# Patient Record
Sex: Female | Born: 1990 | Race: White | Hispanic: No | Marital: Single | Smoking: Never smoker
Health system: Southern US, Community
[De-identification: ages and names within clinical notes are randomized; demographics above are authoritative.]

## PROBLEM LIST (undated history)

## (undated) ENCOUNTER — Inpatient Hospital Stay (HOSPITAL_COMMUNITY): Payer: Self-pay

## (undated) DIAGNOSIS — Z789 Other specified health status: Secondary | ICD-10-CM

## (undated) HISTORY — PX: NO PAST SURGERIES: SHX2092

---
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
## 2013-07-25 ENCOUNTER — Emergency Department (HOSPITAL_COMMUNITY): Payer: Medicaid Other

## 2013-07-25 ENCOUNTER — Emergency Department (HOSPITAL_COMMUNITY)
Admission: EM | Admit: 2013-07-25 | Discharge: 2013-07-25 | Disposition: A | Discharge status: Home / Self Care | Payer: Medicaid Other | Attending: Emergency Medicine | Admitting: Emergency Medicine

## 2013-07-25 ENCOUNTER — Encounter (HOSPITAL_COMMUNITY): Payer: Self-pay | Admitting: Emergency Medicine

## 2013-07-25 DIAGNOSIS — O9989 Other specified diseases and conditions complicating pregnancy, childbirth and the puerperium: Secondary | ICD-10-CM | POA: Insufficient documentation

## 2013-07-25 DIAGNOSIS — R197 Diarrhea, unspecified: Secondary | ICD-10-CM | POA: Diagnosis not present

## 2013-07-25 DIAGNOSIS — R110 Nausea: Secondary | ICD-10-CM | POA: Insufficient documentation

## 2013-07-25 DIAGNOSIS — O2390 Unspecified genitourinary tract infection in pregnancy, unspecified trimester: Secondary | ICD-10-CM | POA: Insufficient documentation

## 2013-07-25 DIAGNOSIS — Z3490 Encounter for supervision of normal pregnancy, unspecified, unspecified trimester: Secondary | ICD-10-CM

## 2013-07-25 DIAGNOSIS — N3000 Acute cystitis without hematuria: Secondary | ICD-10-CM | POA: Insufficient documentation

## 2013-07-25 LAB — HCG, QUANTITATIVE, PREGNANCY: HCG, BETA CHAIN, QUANT, S: 18176 m[IU]/mL — AB (ref <5)

## 2013-07-25 LAB — COMPREHENSIVE METABOLIC PANEL
ALBUMIN: 3.3 g/dL — AB (ref 3.5–5.2)
ALK PHOS: 70 U/L (ref 39–117)
ALT: 21 U/L (ref 0–35)
ANION GAP: 13 (ref 5–15)
AST: 32 U/L (ref 0–37)
BILIRUBIN TOTAL: 0.4 mg/dL (ref 0.3–1.2)
BUN: 11 mg/dL (ref 6–23)
CHLORIDE: 100 meq/L (ref 96–112)
CO2: 22 meq/L (ref 19–32)
Calcium: 9 mg/dL (ref 8.4–10.5)
Creatinine, Ser: 0.59 mg/dL (ref 0.50–1.10)
GFR calc Af Amer: >90 mL/min (ref >90)
GLUCOSE: 73 mg/dL (ref 70–99)
POTASSIUM: 5.1 meq/L (ref 3.7–5.3)
Sodium: 135 mEq/L — ABNORMAL LOW (ref 137–147)
Total Protein: 6.9 g/dL (ref 6.0–8.3)

## 2013-07-25 LAB — URINALYSIS, ROUTINE W REFLEX MICROSCOPIC
Bilirubin Urine: NEGATIVE
GLUCOSE, UA: NEGATIVE mg/dL
HGB URINE DIPSTICK: NEGATIVE
Ketones, ur: NEGATIVE mg/dL
Nitrite: NEGATIVE
PROTEIN: NEGATIVE mg/dL
Specific Gravity, Urine: 1.02 (ref 1.005–1.030)
Urobilinogen, UA: 1 mg/dL (ref 0.0–1.0)
pH: 5.5 (ref 5.0–8.0)

## 2013-07-25 LAB — CBC WITH DIFFERENTIAL/PLATELET
BASOS PCT: 0 % (ref 0–1)
Basophils Absolute: 0 10*3/uL (ref 0.0–0.1)
Eosinophils Absolute: 0.2 10*3/uL (ref 0.0–0.7)
Eosinophils Relative: 1 % (ref 0–5)
HEMATOCRIT: 39.1 % (ref 36.0–46.0)
HEMOGLOBIN: 13.1 g/dL (ref 12.0–15.0)
LYMPHS ABS: 3.4 10*3/uL (ref 0.7–4.0)
LYMPHS PCT: 27 % (ref 12–46)
MCH: 29.2 pg (ref 26.0–34.0)
MCHC: 33.5 g/dL (ref 30.0–36.0)
MCV: 87.3 fL (ref 78.0–100.0)
MONOS PCT: 7 % (ref 3–12)
Monocytes Absolute: 0.9 10*3/uL (ref 0.1–1.0)
NEUTROS ABS: 8.2 10*3/uL — AB (ref 1.7–7.7)
NEUTROS PCT: 65 % (ref 43–77)
Platelets: 329 10*3/uL (ref 150–400)
RBC: 4.48 MIL/uL (ref 3.87–5.11)
RDW: 12.7 % (ref 11.5–15.5)
WBC: 12.7 10*3/uL — AB (ref 4.0–10.5)

## 2013-07-25 LAB — POC URINE PREG, ED: Preg Test, Ur: POSITIVE — AB

## 2013-07-25 LAB — URINE MICROSCOPIC-ADD ON

## 2013-07-25 LAB — LIPASE, BLOOD: Lipase: 27 U/L (ref 11–59)

## 2013-07-25 MED ORDER — CEPHALEXIN 500 MG PO CAPS: 500.0000 mg | ORAL_CAPSULE | Freq: Four times a day (QID) | ORAL | Status: DC

## 2013-07-25 MED ORDER — CEPHALEXIN 250 MG PO CAPS
500.0000 mg | ORAL_CAPSULE | Freq: Once | ORAL | Status: AC
  Administered 2013-07-25: 500 mg via ORAL
  Filled 2013-07-25: qty 2

## 2013-07-25 NOTE — Discharge Instructions (Signed)
Please read and follow all provided instructions.  Your diagnoses today include:  1. Acute cystitis without hematuria   2. Pregnancy     Tests performed today include:  Urine test - suggests that you have an infection in your bladder  Urine pregnancy test - you are pregnant.   Ultrasound - shows that you have a pregnancy in the uterus and are 5 weeks and 6 days along.   Vital signs. See below for your results today.   Medications prescribed:   Keflex (cephalexin) - antibiotic  You have been prescribed an antibiotic medicine: take the entire course of medicine even if you are feeling better. Stopping early can cause the antibiotic not to work.  Home care instructions:  Follow any educational materials contained in this packet.  Follow-up instructions: Please follow-up with your primary care provider in 3 days if symptoms are not resolved for further evaluation of your symptoms.  Return instructions:   Please return to the Emergency Department if you experience worsening symptoms.   Return with fever, worsening pain, persistent vomiting, worsening pain in your back.   Please return if you have any other emergent concerns.  Additional Information:  Your vital signs today were: BP 113/46   Pulse 81   Temp(Src) 98.8 F (37.1 C) (Oral)   Resp 20   Wt 264 lb 2 oz (119.806 kg)   SpO2 100%   LMP 06/10/2013 If your blood pressure (BP) was elevated above 135/85 this visit, please have this repeated by your doctor within one month. --------------

## 2013-07-25 NOTE — ED Notes (Signed)
Pt reports 6/10 right lower abdominal pain that started this morning. Pt states she has had nausea, diarrhea, denies vomiting. Pt denies urinary symptoms, fever/chills.  Pt concerns she is could be pregnant. LMP 06/10/13. NAD noted, pt A&O x4.

## 2013-07-25 NOTE — ED Notes (Signed)
Patient transported to Ultrasound 

## 2013-07-25 NOTE — ED Notes (Signed)
Pt states she has had diarrhea for about five days. Reports lower abdominal pain, states not specifically the right side as earlier stated. Nausea at times (with a positive urine pregnancy test).

## 2013-07-25 NOTE — ED Provider Notes (Signed)
CSN: [id number]     Arrival date & time 07/25/13  1709 History   First MD Initiated Contact with Patient 07/25/13 2023     Chief Complaint  Patient presents with  . Abdominal Pain     (Consider location/radiation/quality/duration/timing/severity/associated sxs/prior Treatment) HPI Comments: Patient presents with complaint of suprapubic abdominal pain that began this morning with associated nausea, diarrhea. No vomiting. Patient denies fever, chills, chest pain or cough. No shortness of breath. No vaginal bleeding or discharge. Last menstrual period was approximately 6 weeks ago and was normal for her. Patient thinks that she could be pregnant. These symptoms do not feel like previous UTI. She does not have right lower quadrant or left lower quadrant pain. She has not taken a home pregnancy test. No treatments prior to arrival. The onset of this condition was acute. The course is constant. Aggravating factors: none. Alleviating factors: none.    Patient is a 23 y.o. female presenting with abdominal pain. The history is provided by the patient.  Abdominal Pain Associated symptoms: diarrhea and nausea   Associated symptoms: no chest pain, no cough, no dysuria, no fever, no sore throat, no vaginal bleeding, no vaginal discharge and no vomiting     History reviewed. No pertinent past medical history. History reviewed. No pertinent past surgical history. History reviewed. No pertinent family history. History  Substance Use Topics  . Smoking status: Never Smoker   . Smokeless tobacco: Not on file  . Alcohol Use: Yes     Comment: occasionaly   OB History   Grav Para Term Preterm Abortions TAB SAB Ect Mult Living   1 1 1   0     1     Review of Systems  Constitutional: Negative for fever.  HENT: Negative for rhinorrhea and sore throat.   Eyes: Negative for redness.  Respiratory: Negative for cough.   Cardiovascular: Negative for chest pain.  Gastrointestinal: Positive for nausea,  abdominal pain (suprapubic) and diarrhea. Negative for vomiting.  Genitourinary: Negative for dysuria, urgency, flank pain, vaginal bleeding and vaginal discharge.  Musculoskeletal: Negative for myalgias.  Skin: Negative for rash.  Neurological: Negative for headaches.    Allergies  Review of patient's allergies indicates no known allergies.  Home Medications   Prior to Admission medications   Not on File   BP 113/46  Pulse 79  Temp(Src) 98.8 F (37.1 C) (Oral)  Resp 17  Wt 264 lb 2 oz (119.806 kg)  SpO2 100%  LMP 06/10/2013  Physical Exam  Nursing note and vitals reviewed. Constitutional: She appears well-developed and well-nourished.  HENT:  Head: Normocephalic and atraumatic.  Eyes: Conjunctivae are normal. Right eye exhibits no discharge. Left eye exhibits no discharge.  Neck: Normal range of motion. Neck supple.  Cardiovascular: Normal rate, regular rhythm and normal heart sounds.   No murmur heard. Pulmonary/Chest: Effort normal and breath sounds normal. No respiratory distress. She has no wheezes. She has no rales.  Abdominal: Soft. There is tenderness (Mild, suprapubic). There is no rebound and no guarding.  Neurological: She is alert.  Skin: Skin is warm and dry.  Psychiatric: She has a normal mood and affect.    ED Course  Procedures (including critical care time) Labs Review Labs Reviewed  CBC WITH DIFFERENTIAL - Abnormal; Notable for the following:    WBC 12.7 (*)    Neutro Abs 8.2 (*)    All other components within normal limits  COMPREHENSIVE METABOLIC PANEL - Abnormal; Notable for the following:  Sodium 135 (*)    Albumin 3.3 (*)    All other components within normal limits  URINALYSIS, ROUTINE W REFLEX MICROSCOPIC - Abnormal; Notable for the following:    APPearance CLOUDY (*)    Leukocytes, UA SMALL (*)    All other components within normal limits  URINE MICROSCOPIC-ADD ON - Abnormal; Notable for the following:    Squamous Epithelial / LPF  FEW (*)    Bacteria, UA MANY (*)    All other components within normal limits  HCG, QUANTITATIVE, PREGNANCY - Abnormal; Notable for the following:    hCG, Beta Chain, Quant, S 18176 (*)    All other components within normal limits  POC URINE PREG, ED - Abnormal; Notable for the following:    Preg Test, Ur POSITIVE (*)    All other components within normal limits  URINE CULTURE  LIPASE, BLOOD    Imaging Review US Ob Comp Less 14 Wks  07/25/2013   CLINICAL DATA:  Pelvic pain  EXAM: OBSTETRIC <14 WK [place] AND TRANSVAGINAL OB US  TECHNIQUE: Both transabdominal and transvaginal ultrasound examinations were performed for complete evaluation of the gestation as well as the maternal uterus, adnexal regions, and pelvic cul-de-sac. Transvaginal technique was performed to assess early pregnancy.  COMPARISON:  None.  FINDINGS: Intrauterine gestational sac: Visualized/normal in shape.  Yolk sac:  Visualized  Embryo:  Visualized  Cardiac Activity: Visualized  Heart Rate:  113 bpm  CRL:   3  mm   5 w 6 d                  [place] EDC: March 20, 2013  Maternal uterus/adnexae: There is no demonstrable subchorionic hemorrhage. Cervical os appears closed. Maternal ovaries appear normal bilaterally. No free pelvic fluid.  IMPRESSION: Single live intrauterine gestation with estimated gestational age of 6- weeks. Study otherwise unremarkable.   Electronically Signed   By: Bretta Bang M.D.   On: 07/25/2013 21:57   US Ob Transvaginal  07/25/2013   CLINICAL DATA:  Pelvic pain  EXAM: OBSTETRIC <14 WK [place] AND TRANSVAGINAL OB US  TECHNIQUE: Both transabdominal and transvaginal ultrasound examinations were performed for complete evaluation of the gestation as well as the maternal uterus, adnexal regions, and pelvic cul-de-sac. Transvaginal technique was performed to assess early pregnancy.  COMPARISON:  None.  FINDINGS: Intrauterine gestational sac: Visualized/normal in shape.  Yolk sac:  Visualized  Embryo:  Visualized  Cardiac  Activity: Visualized  Heart Rate:  113 bpm  CRL:   3  mm   5 w 6 d                  [place] EDC: March 20, 2013  Maternal uterus/adnexae: There is no demonstrable subchorionic hemorrhage. Cervical os appears closed. Maternal ovaries appear normal bilaterally. No free pelvic fluid.  IMPRESSION: Single live intrauterine gestation with estimated gestational age of 23- weeks. Study otherwise unremarkable.   Electronically Signed   By: Bretta Bang M.D.   On: 07/25/2013 21:57     EKG Interpretation None      9:15 PM Patient seen and examined. Work-up initiated. Quant and [place] ordered. Suspect UTI. Will ensure IUP.    Vital signs reviewed and are as follows: Filed Vitals:   07/25/13 2059  BP: 113/46  Pulse: 79  Temp:   Resp: 17   IUP confirmed. Patient informed. First dose of Keflex given in emergency department tonight.  Encouraged patient to begin prenatal vitamins and to followup with her  PCP/GYN.  Patient urged to return with worsening abdominal pain, vaginal bleeding, fever, vomiting, or other concerns. She verbalizes understanding and agrees with plan.   MDM   Final diagnoses:  Acute cystitis without hematuria  Pregnancy   Pregnancy: Due to lower abdominal cramping, IUP confirmed with ultrasound. Do not suspect ectopic pregnancy.  Acute cystitis: Explains patient's urinary symptoms, will treat with Keflex. Culture sent.   Renne CriglerJoshua Zamauri Nez, PA-C 07/25/13 2329

## 2013-07-26 NOTE — ED Provider Notes (Signed)
Medical screening examination/treatment/procedure(s) were performed by non-physician practitioner and as supervising physician I was immediately available for consultation/collaboration.   EKG Interpretation None       Doloros Kwolek, MD 07/26/13 0133 

## 2013-07-27 LAB — URINE CULTURE
Colony Count: NO GROWTH
Culture: NO GROWTH

## 2013-10-03 LAB — OB RESULTS CONSOLE HEPATITIS B SURFACE ANTIGEN: Hepatitis B Surface Ag: NEGATIVE

## 2013-10-03 LAB — OB RESULTS CONSOLE HIV ANTIBODY (ROUTINE TESTING): HIV: NONREACTIVE

## 2013-10-03 LAB — OB RESULTS CONSOLE ANTIBODY SCREEN: ANTIBODY SCREEN: NEGATIVE

## 2013-10-03 LAB — OB RESULTS CONSOLE RUBELLA ANTIBODY, IGM: RUBELLA: IMMUNE

## 2013-10-03 LAB — OB RESULTS CONSOLE ABO/RH: RH TYPE: POSITIVE

## 2013-10-03 LAB — OB RESULTS CONSOLE GC/CHLAMYDIA
Chlamydia: NEGATIVE
Gonorrhea: NEGATIVE

## 2013-10-03 LAB — OB RESULTS CONSOLE RPR: RPR: NONREACTIVE

## 2013-11-14 ENCOUNTER — Encounter (HOSPITAL_COMMUNITY): Payer: Self-pay | Admitting: Emergency Medicine

## 2014-01-13 NOTE — L&D Delivery Note (Signed)
Delivery Note Patient was noted to be 10/10/+2.  She pushed well for 10 minutes.  At 9:36 AM a viable female was delivered via Vaginal, Spontaneous Delivery (Presentation: Right Occiput Anterior).  APGAR: 8, 9; weight  pending .   Placenta status: Intact, Spontaneous.  Cord: 3 vessels with the following complications: None.  Cord pH: n/a  Anesthesia: Epidural  Episiotomy: None Lacerations: None Suture Repair: n/a Est. Blood Loss (mL): 300  Mom to postpartum.  Baby to Couplet care / Skin to Skin.  Marlow BaarsCLARK, Alysha Doolan 03/16/2014, 10:09 AM

## 2014-02-14 LAB — OB RESULTS CONSOLE GBS: GBS: NEGATIVE

## 2014-02-25 ENCOUNTER — Inpatient Hospital Stay (HOSPITAL_COMMUNITY)
Admission: AD | Admit: 2014-02-25 | Discharge: 2014-02-25 | Disposition: A | Discharge status: Home / Self Care | Payer: Medicaid Other | Source: Ambulatory Visit | Attending: Obstetrics and Gynecology | Admitting: Obstetrics and Gynecology

## 2014-02-25 ENCOUNTER — Encounter (HOSPITAL_COMMUNITY): Payer: Self-pay | Admitting: *Deleted

## 2014-02-25 DIAGNOSIS — Z3A36 36 weeks gestation of pregnancy: Secondary | ICD-10-CM | POA: Diagnosis not present

## 2014-02-25 DIAGNOSIS — O9989 Other specified diseases and conditions complicating pregnancy, childbirth and the puerperium: Secondary | ICD-10-CM | POA: Diagnosis present

## 2014-02-25 HISTORY — DX: Other specified health status: Z78.9

## 2014-02-25 LAB — POCT FERN TEST: POCT FERN TEST: NEGATIVE

## 2014-02-25 NOTE — MAU Provider Note (Signed)
Pt is 9029w4d pregnant G2P1001 who presents with being "damp" for about 2 weeks.   After discussing with friend, she decided she needed to get checked out.  Pt is not contracting and denies spotting/bleeding. Pt has hx of full term delivery with past pregnancy Baby is active. occ mild ctx.FHR 140 bpm, no declerations, reactive Speculum exam- small amount of creamy white discharge in vault; no pling, cervix 1.5 cm Fern exam by RN- neg Labor check per RN Pamelia HoitSusan Taariq Leitz, [facility]NP

## 2014-02-25 NOTE — MAU Note (Signed)
Pt stated she thinks she has been leaking fluid for a few days. Stated having a discahrge that is keepin her panties constantly wet. Denies any ctx good fetal movement up until today movement a little less than usual today

## 2014-03-15 ENCOUNTER — Encounter (HOSPITAL_COMMUNITY): Payer: Self-pay

## 2014-03-15 ENCOUNTER — Inpatient Hospital Stay (HOSPITAL_COMMUNITY)
Admission: AD | Admit: 2014-03-15 | Discharge: 2014-03-17 | DRG: 775 | Disposition: A | Discharge status: Home / Self Care | Payer: Medicaid Other | Source: Ambulatory Visit | Attending: Obstetrics and Gynecology | Admitting: Obstetrics and Gynecology

## 2014-03-15 DIAGNOSIS — O133 Gestational [pregnancy-induced] hypertension without significant proteinuria, third trimester: Secondary | ICD-10-CM

## 2014-03-15 DIAGNOSIS — Z3A39 39 weeks gestation of pregnancy: Secondary | ICD-10-CM | POA: Diagnosis present

## 2014-03-15 DIAGNOSIS — O139 Gestational [pregnancy-induced] hypertension without significant proteinuria, unspecified trimester: Secondary | ICD-10-CM | POA: Diagnosis present

## 2014-03-15 NOTE — H&P (Signed)
24 y.o. 4151w1d  G2P1001 comes in c/o labor.  She also has elevated BPs 140-150/80s.  Otherwise has good fetal movement and no bleeding.  Past Medical History  Diagnosis Date  . Medical history non-contributory     Past Surgical History  Procedure Laterality Date  . No past surgeries      OB History  Gravida Para Term Preterm AB SAB TAB Ectopic Multiple Living  2 1 1   0     1    # Outcome Date GA Lbr Len/2nd Weight Sex Delivery Anes PTL Lv  2 Current           1 Term 09/19/07 2556w0d  3.289 kg (7 lb 4 oz)  Vag-Spont EPI  Y      History   Social History  . Marital Status: Single    Spouse Name: N/A  . Number of Children: N/A  . Years of Education: N/A   Occupational History  . Not on file.   Social History Main Topics  . Smoking status: Never Smoker   . Smokeless tobacco: Not on file  . Alcohol Use: Yes     Comment: occasionaly  . Drug Use: No  . Sexual Activity: Yes    Birth Control/ Protection: None   Other Topics Concern  . Not on file   Social History Narrative   Review of patient's allergies indicates no known allergies.    Prenatal Transfer Tool  Maternal Diabetes: No Genetic Screening: Normal Maternal Ultrasounds/Referrals: Normal Fetal Ultrasounds or other Referrals:  None Maternal Substance Abuse:  No Significant Maternal Medications:  None Significant Maternal Lab Results: None  Other PNC: uncomplicated.    Filed Vitals:   03/15/14 2308 03/15/14 2316  BP: 152/86 146/84  Pulse: 94 95  TempSrc: Oral   Resp: 20   Height: 5\' 5"  (1.651 m)   Weight: 117.028 kg (258 lb)   SpO2: 98%     Lungs/Cor:  NAD Abdomen:  soft, gravid Ex:  no cords, erythema SVE:  3/50/-2 per nurse FHTs:  130s, good STV, accels; not strictly reactive yet Toco:  q3-4   A/P   Full term multip with PIH- labs pending. Admit for induction.  GBS neg.  Sanjana Folz A

## 2014-03-16 ENCOUNTER — Inpatient Hospital Stay (HOSPITAL_COMMUNITY): Payer: Medicaid Other | Admitting: Anesthesiology

## 2014-03-16 ENCOUNTER — Encounter (HOSPITAL_COMMUNITY): Payer: Self-pay | Admitting: *Deleted

## 2014-03-16 DIAGNOSIS — Z3483 Encounter for supervision of other normal pregnancy, third trimester: Secondary | ICD-10-CM | POA: Diagnosis present

## 2014-03-16 DIAGNOSIS — O139 Gestational [pregnancy-induced] hypertension without significant proteinuria, unspecified trimester: Secondary | ICD-10-CM | POA: Diagnosis present

## 2014-03-16 DIAGNOSIS — Z3A39 39 weeks gestation of pregnancy: Secondary | ICD-10-CM | POA: Diagnosis present

## 2014-03-16 LAB — CBC
HCT: 35.4 % — ABNORMAL LOW (ref 36.0–46.0)
Hemoglobin: 11.8 g/dL — ABNORMAL LOW (ref 12.0–15.0)
MCH: 27.4 pg (ref 26.0–34.0)
MCHC: 33.3 g/dL (ref 30.0–36.0)
MCV: 82.3 fL (ref 78.0–100.0)
Platelets: 317 10*3/uL (ref 150–400)
RBC: 4.3 MIL/uL (ref 3.87–5.11)
RDW: 13.1 % (ref 11.5–15.5)
WBC: 14 10*3/uL — ABNORMAL HIGH (ref 4.0–10.5)

## 2014-03-16 LAB — COMPREHENSIVE METABOLIC PANEL
ALK PHOS: 165 U/L — AB (ref 39–117)
ALT: 21 U/L (ref 0–35)
ANION GAP: 8 (ref 5–15)
AST: 22 U/L (ref 0–37)
Albumin: 2.6 g/dL — ABNORMAL LOW (ref 3.5–5.2)
BUN: 14 mg/dL (ref 6–23)
CHLORIDE: 107 mmol/L (ref 96–112)
CO2: 21 mmol/L (ref 19–32)
Calcium: 9 mg/dL (ref 8.4–10.5)
Creatinine, Ser: 0.72 mg/dL (ref 0.50–1.10)
GFR calc Af Amer: >90 mL/min (ref >90)
Glucose, Bld: 122 mg/dL — ABNORMAL HIGH (ref 70–99)
POTASSIUM: 4.1 mmol/L (ref 3.5–5.1)
SODIUM: 136 mmol/L (ref 135–145)
Total Bilirubin: 0.4 mg/dL (ref 0.3–1.2)
Total Protein: 5.9 g/dL — ABNORMAL LOW (ref 6.0–8.3)

## 2014-03-16 LAB — URINALYSIS, ROUTINE W REFLEX MICROSCOPIC
Bilirubin Urine: NEGATIVE
GLUCOSE, UA: NEGATIVE mg/dL
Ketones, ur: NEGATIVE mg/dL
NITRITE: NEGATIVE
PROTEIN: NEGATIVE mg/dL
Specific Gravity, Urine: 1.02 (ref 1.005–1.030)
UROBILINOGEN UA: 0.2 mg/dL (ref 0.0–1.0)
pH: 6 (ref 5.0–8.0)

## 2014-03-16 LAB — URINE MICROSCOPIC-ADD ON

## 2014-03-16 LAB — TYPE AND SCREEN
ABO/RH(D): B POS
Antibody Screen: NEGATIVE

## 2014-03-16 LAB — RPR: RPR: NONREACTIVE

## 2014-03-16 LAB — ABO/RH: ABO/RH(D): B POS

## 2014-03-16 LAB — URIC ACID: Uric Acid, Serum: 6.3 mg/dL (ref 2.4–7.0)

## 2014-03-16 MED ORDER — BENZOCAINE-MENTHOL 20-0.5 % EX AERO
1.0000 "application " | INHALATION_SPRAY | CUTANEOUS | Status: DC | PRN
  Filled 2014-03-16: qty 56

## 2014-03-16 MED ORDER — OXYCODONE-ACETAMINOPHEN 5-325 MG PO TABS: 1.0000 | ORAL_TABLET | ORAL | Status: DC | PRN

## 2014-03-16 MED ORDER — TETANUS-DIPHTH-ACELL PERTUSSIS 5-2.5-18.5 LF-MCG/0.5 IM SUSP: 0.5000 mL | Freq: Once | INTRAMUSCULAR | Status: DC

## 2014-03-16 MED ORDER — DIPHENHYDRAMINE HCL 25 MG PO CAPS: 25.0000 mg | ORAL_CAPSULE | Freq: Four times a day (QID) | ORAL | Status: DC | PRN

## 2014-03-16 MED ORDER — CITRIC ACID-SODIUM CITRATE 334-500 MG/5ML PO SOLN: 30.0000 mL | ORAL | Status: DC | PRN

## 2014-03-16 MED ORDER — LACTATED RINGERS IV SOLN
INTRAVENOUS | Status: DC
  Administered 2014-03-16: 01:00:00 via INTRAVENOUS

## 2014-03-16 MED ORDER — DIBUCAINE 1 % RE OINT
1.0000 "application " | TOPICAL_OINTMENT | RECTAL | Status: DC | PRN
  Filled 2014-03-16: qty 28

## 2014-03-16 MED ORDER — ACETAMINOPHEN 325 MG PO TABS: 650.0000 mg | ORAL_TABLET | ORAL | Status: DC | PRN

## 2014-03-16 MED ORDER — OXYTOCIN 40 UNITS IN LACTATED RINGERS INFUSION - SIMPLE MED
1.0000 m[IU]/min | INTRAVENOUS | Status: DC
  Administered 2014-03-16: 2 m[IU]/min via INTRAVENOUS
  Filled 2014-03-16: qty 1000

## 2014-03-16 MED ORDER — LIDOCAINE HCL (PF) 1 % IJ SOLN
INTRAMUSCULAR | Status: DC | PRN
  Administered 2014-03-16 (×2): 4 mL

## 2014-03-16 MED ORDER — INFLUENZA VAC SPLIT QUAD 0.5 ML IM SUSY: 0.5000 mL | PREFILLED_SYRINGE | INTRAMUSCULAR | Status: DC

## 2014-03-16 MED ORDER — OXYCODONE-ACETAMINOPHEN 5-325 MG PO TABS
1.0000 | ORAL_TABLET | ORAL | Status: DC | PRN
  Administered 2014-03-16: 1 via ORAL
  Filled 2014-03-16: qty 1

## 2014-03-16 MED ORDER — ONDANSETRON HCL 4 MG/2ML IJ SOLN: 4.0000 mg | INTRAMUSCULAR | Status: DC | PRN

## 2014-03-16 MED ORDER — OXYCODONE-ACETAMINOPHEN 5-325 MG PO TABS: 2.0000 | ORAL_TABLET | ORAL | Status: DC | PRN

## 2014-03-16 MED ORDER — DIPHENHYDRAMINE HCL 50 MG/ML IJ SOLN: 12.5000 mg | INTRAMUSCULAR | Status: DC | PRN

## 2014-03-16 MED ORDER — ONDANSETRON HCL 4 MG PO TABS: 4.0000 mg | ORAL_TABLET | ORAL | Status: DC | PRN

## 2014-03-16 MED ORDER — WITCH HAZEL-GLYCERIN EX PADS: 1.0000 "application " | MEDICATED_PAD | CUTANEOUS | Status: DC | PRN

## 2014-03-16 MED ORDER — OXYTOCIN BOLUS FROM INFUSION
500.0000 mL | INTRAVENOUS | Status: DC
  Administered 2014-03-16: 500 mL via INTRAVENOUS

## 2014-03-16 MED ORDER — FENTANYL 2.5 MCG/ML BUPIVACAINE 1/10 % EPIDURAL INFUSION (WH - ANES)
14.0000 mL/h | INTRAMUSCULAR | Status: DC | PRN
  Administered 2014-03-16: 14 mL/h via EPIDURAL
  Filled 2014-03-16: qty 125

## 2014-03-16 MED ORDER — PHENYLEPHRINE 40 MCG/ML (10ML) SYRINGE FOR IV PUSH (FOR BLOOD PRESSURE SUPPORT): 80.0000 ug | PREFILLED_SYRINGE | INTRAVENOUS | Status: DC | PRN

## 2014-03-16 MED ORDER — IBUPROFEN 600 MG PO TABS
600.0000 mg | ORAL_TABLET | Freq: Four times a day (QID) | ORAL | Status: DC
  Administered 2014-03-16 – 2014-03-17 (×5): 600 mg via ORAL
  Filled 2014-03-16 (×5): qty 1

## 2014-03-16 MED ORDER — SENNOSIDES-DOCUSATE SODIUM 8.6-50 MG PO TABS
2.0000 | ORAL_TABLET | ORAL | Status: DC
  Administered 2014-03-16: 2 via ORAL
  Filled 2014-03-16: qty 2

## 2014-03-16 MED ORDER — PHENYLEPHRINE 40 MCG/ML (10ML) SYRINGE FOR IV PUSH (FOR BLOOD PRESSURE SUPPORT)
80.0000 ug | PREFILLED_SYRINGE | INTRAVENOUS | Status: DC | PRN
  Filled 2014-03-16: qty 20

## 2014-03-16 MED ORDER — LACTATED RINGERS IV SOLN
500.0000 mL | Freq: Once | INTRAVENOUS | Status: AC
  Administered 2014-03-16: 500 mL via INTRAVENOUS

## 2014-03-16 MED ORDER — LACTATED RINGERS IV SOLN: 500.0000 mL | INTRAVENOUS | Status: DC | PRN

## 2014-03-16 MED ORDER — FENTANYL 2.5 MCG/ML BUPIVACAINE 1/10 % EPIDURAL INFUSION (WH - ANES)
INTRAMUSCULAR | Status: DC | PRN
  Administered 2014-03-16: 14 mL/h via EPIDURAL

## 2014-03-16 MED ORDER — ZOLPIDEM TARTRATE 5 MG PO TABS: 5.0000 mg | ORAL_TABLET | Freq: Every evening | ORAL | Status: DC | PRN

## 2014-03-16 MED ORDER — LANOLIN HYDROUS EX OINT: TOPICAL_OINTMENT | CUTANEOUS | Status: DC | PRN

## 2014-03-16 MED ORDER — EPHEDRINE 5 MG/ML INJ: 10.0000 mg | INTRAVENOUS | Status: DC | PRN

## 2014-03-16 MED ORDER — OXYTOCIN 40 UNITS IN LACTATED RINGERS INFUSION - SIMPLE MED
62.5000 mL/h | INTRAVENOUS | Status: DC
  Administered 2014-03-16: 62.5 mL/h via INTRAVENOUS

## 2014-03-16 MED ORDER — SIMETHICONE 80 MG PO CHEW: 80.0000 mg | CHEWABLE_TABLET | ORAL | Status: DC | PRN

## 2014-03-16 MED ORDER — FLEET ENEMA 7-19 GM/118ML RE ENEM: 1.0000 | ENEMA | RECTAL | Status: DC | PRN

## 2014-03-16 MED ORDER — LIDOCAINE HCL (PF) 1 % IJ SOLN
30.0000 mL | INTRAMUSCULAR | Status: DC | PRN
  Filled 2014-03-16: qty 30

## 2014-03-16 MED ORDER — TERBUTALINE SULFATE 1 MG/ML IJ SOLN: 0.2500 mg | Freq: Once | INTRAMUSCULAR | Status: DC | PRN

## 2014-03-16 MED ORDER — ONDANSETRON HCL 4 MG/2ML IJ SOLN: 4.0000 mg | Freq: Four times a day (QID) | INTRAMUSCULAR | Status: DC | PRN

## 2014-03-16 NOTE — Anesthesia Postprocedure Evaluation (Signed)
  Anesthesia Post-op Note  Patient: Mandy Terry  Procedure(s) Performed: * No procedures listed *  Patient Location: PACU and Mother/Baby  Anesthesia Type:Epidural  Level of Consciousness: awake, alert  and oriented  Airway and Oxygen Therapy: Patient Spontanous Breathing  Post-op Pain: mild  Post-op Assessment: Post-op Vital signs reviewed, Patient's Cardiovascular Status Stable, Respiratory Function Stable, No signs of Nausea or vomiting, Adequate PO intake, Pain level controlled, No headache, No backache, No residual numbness and No residual motor weakness  Post-op Vital Signs: Reviewed and stable  Last Vitals:  Filed Vitals:   03/16/14 1245  BP: 130/57  Pulse: 80  Temp: 37.2 C  Resp: 18    Complications: No apparent anesthesia complications

## 2014-03-16 NOTE — Anesthesia Preprocedure Evaluation (Signed)
Anesthesia Evaluation  Patient identified by MRN, date of birth, ID band Patient awake    Reviewed: Allergy & Precautions, NPO status , Patient's Chart, lab work & pertinent test results  History of Anesthesia Complications Negative for: history of anesthetic complications  Airway Mallampati: III  TM Distance: >3 FB Neck ROM: Full    Dental no notable dental hx. (+) Dental Advisory Given   Pulmonary neg pulmonary ROS,  breath sounds clear to auscultation  Pulmonary exam normal       Cardiovascular negative cardio ROS  Rhythm:Regular Rate:Normal     Neuro/Psych negative neurological ROS  negative psych ROS   GI/Hepatic negative GI ROS, Neg liver ROS,   Endo/Other  Morbid obesity  Renal/GU negative Renal ROS  negative genitourinary   Musculoskeletal negative musculoskeletal ROS (+)   Abdominal   Peds negative pediatric ROS (+)  Hematology negative hematology ROS (+)   Anesthesia Other Findings   Reproductive/Obstetrics (+) Pregnancy                             Anesthesia Physical Anesthesia Plan  ASA: III  Anesthesia Plan: Epidural   Post-op Pain Management:    Induction:   Airway Management Planned:   Additional Equipment:   Intra-op Plan:   Post-operative Plan:   Informed Consent: I have reviewed the patients History and Physical, chart, labs and discussed the procedure including the risks, benefits and alternatives for the proposed anesthesia with the patient or authorized representative who has indicated his/her understanding and acceptance.   Dental advisory given  Plan Discussed with:   Anesthesia Plan Comments:         Anesthesia Quick Evaluation

## 2014-03-16 NOTE — Progress Notes (Signed)
Pt seen and examined.  Comfortable w epidural.   IOL for GHTN at term.  No preE symptoms  BP 124/52 mmHg  Pulse 84  Temp(Src) 98.5 F (36.9 C) (Axillary)  Resp 18  Ht 5\' 5"  (1.651 m)  Wt 117.028 kg (258 lb)  BMI 42.93 kg/m2  SpO2 99%  LMP 06/10/2013  EFW 7.5# EFM: 120s, mod varibility Toco q2-3 min SVE: 7/100/-1, AROM clear fluid  Anticipate SVD.  FSR

## 2014-03-16 NOTE — Anesthesia Procedure Notes (Signed)
Epidural Patient location during procedure: OB Start time: 03/16/2014 6:58 AM  Staffing Anesthesiologist: Felipe DroneJUDD, Jhase Creppel JENNETTE Performed by: anesthesiologist   Preanesthetic Checklist Completed: patient identified, site marked, surgical consent, pre-op evaluation, timeout performed, IV checked, risks and benefits discussed and monitors and equipment checked  Epidural Patient position: sitting Prep: site prepped and draped and DuraPrep Patient monitoring: continuous pulse ox and blood pressure Approach: midline Location: L3-L4 Injection technique: LOR saline  Needle:  Needle type: Tuohy  Needle gauge: 17 G Needle length: 9 cm and 9 Needle insertion depth: 7 cm Catheter type: closed end flexible Catheter size: 19 Gauge Catheter at skin depth: 12 cm Test dose: negative  Assessment Events: blood not aspirated, injection not painful, no injection resistance, negative IV test and no paresthesia  Additional Notes Patient identified. Risks/Benefits/Options discussed with patient including but not limited to bleeding, infection, nerve damage, paralysis, failed block, incomplete pain control, headache, blood pressure changes, nausea, vomiting, reactions to medication both or allergic, itching and postpartum back pain. Confirmed with bedside nurse the patient's most recent platelet count. Confirmed with patient that they are not currently taking any anticoagulation, have any bleeding history or any family history of bleeding disorders. Patient expressed understanding and wished to proceed. All questions were answered. Sterile technique was used throughout the entire procedure. Please see nursing notes for vital signs. Test dose was given through epidural catheter and negative prior to continuing to dose epidural or start infusion. Warning signs of high block given to the patient including shortness of breath, tingling/numbness in hands, complete motor block, or any concerning symptoms with  instructions to call for help. Patient was given instructions on fall risk and not to get out of bed. All questions and concerns addressed with instructions to call with any issues or inadequate analgesia.

## 2014-03-16 NOTE — Lactation Note (Signed)
This note was copied from the chart of Mandy Terry. Lactation Consultation Note  Patient Name: Mandy Dinah BeersSummer Guimond Today's Date: 03/16/2014 Reason for consult: Initial assessment  Baby is 4 hours old and mom plans to breast feed has long has she can. Per mom baby breast fed after delivery for 30 mins . LC reviewed basics and assisted mom with positioning on the left breast in football , hand expressed , steady flow of colostrum noted.  Position, also to obtain the depth. Multiply swallows noted , increased with breast compressions. Baby released and nipple rounded. Mom burped the baby and she was still showing feeding  Cues so mom re- latched and was able to obtain depth well with consistent feeding pattern , and swallows. Per mom [facility]. [facility] , LC suggested she call WIC to make an apt. Will need hand pump for discharge.  Mother informed of post-discharge support and given phone number to the lactation department, including services for phone call assistance;  out-patient appointments; and breastfeeding support group. List of other breastfeeding resources in the community given in the handout. Encouraged  mother to call for problems or concerns related to breastfeeding.   Maternal Data Formula Feeding for Exclusion: No Has patient been taught Hand Expression?: Yes (steady flow of colostrum ) Does the patient have breastfeeding experience prior to this delivery?: Yes  Feeding Feeding Type: Breast Fed Length of feed: 7 min  LATCH Score/Interventions Latch: Grasps breast easily, tongue down, lips flanged, rhythmical sucking. Intervention(s): Adjust position;Assist with latch;Breast massage;Breast compression  Audible Swallowing: Spontaneous and intermittent  Type of Nipple: Everted at rest and after stimulation Intervention(s): Reverse pressure  Comfort (Breast/Nipple): Soft / non-tender     Hold (Positioning):  Assistance needed to correctly position infant at breast and maintain latch. Intervention(s): Breastfeeding basics reviewed;Support Pillows;Position options;Skin to skin  LATCH Score: 9  Lactation Tools Discussed/Used WIC Program: No ([facility]   Consult Status Consult Status: Follow-up Date: 03/17/14 Follow-up type: In-patient    Kathrin Greathouseorio, Elver Stadler Ann 03/16/2014, 1:39 PM

## 2014-03-17 LAB — CBC
HCT: 32.8 % — ABNORMAL LOW (ref 36.0–46.0)
HEMOGLOBIN: 10.9 g/dL — AB (ref 12.0–15.0)
MCH: 27.7 pg (ref 26.0–34.0)
MCHC: 33.2 g/dL (ref 30.0–36.0)
MCV: 83.5 fL (ref 78.0–100.0)
Platelets: 250 10*3/uL (ref 150–400)
RBC: 3.93 MIL/uL (ref 3.87–5.11)
RDW: 13.2 % (ref 11.5–15.5)
WBC: 13 10*3/uL — ABNORMAL HIGH (ref 4.0–10.5)

## 2014-03-17 MED ORDER — DOCUSATE SODIUM 100 MG PO CAPS
100.0000 mg | ORAL_CAPSULE | Freq: Two times a day (BID) | ORAL | Status: AC
Start: 2014-03-17 — End: ?

## 2014-03-17 MED ORDER — IBUPROFEN 600 MG PO TABS: 600.0000 mg | ORAL_TABLET | Freq: Four times a day (QID) | ORAL | Status: AC | PRN

## 2014-03-17 MED ORDER — OXYCODONE-ACETAMINOPHEN 5-325 MG PO TABS: 2.0000 | ORAL_TABLET | ORAL | Status: AC | PRN

## 2014-03-17 NOTE — Discharge Summary (Signed)
Obstetric Discharge Summary Reason for Admission: onset of labor Prenatal Procedures: ultrasound Intrapartum Procedures: spontaneous vaginal delivery Postpartum Procedures: none Complications-Operative and Postpartum: none HEMOGLOBIN  Date Value Ref Range Status  03/17/2014 10.9* 12.0 - 15.0 g/dL Final   HCT  Date Value Ref Range Status  03/17/2014 32.8* 36.0 - 46.0 % Final    Physical Exam:  General: alert, cooperative and appears stated age 43Lochia: appropriate Uterine Fundus: firm  Discharge Diagnoses: Term Pregnancy-delivered  Discharge Information: Date: 03/17/2014 Activity: pelvic rest Diet: routine Medications: Ibuprofen, Colace and Percocet Condition: improved Instructions: refer to practice specific booklet Discharge to: home   Newborn Data: Live born female  Birth Weight: 8 lb 3.6 oz (3731 g) APGAR: 8, 9  Home with mother.  Mandy Wyche H. 03/17/2014, 9:11 AM

## 2014-03-17 NOTE — Lactation Note (Signed)
This note was copied from the chart of Mandy Terry. Lactation Consultation Note  [facility]  Provided information and encouraged her to make appt with WIC. Mother states her right nipple is tender from latching.  Discussed how to achieve a deeper latch.  She has comfort gels. Mom encouraged to feed baby 8-12 times/24 hours and with feeding cues.  Mother states breastfeeding going well. Suggest she call if she needs further assistance.  Patient Name: Mandy Terry Today's Date: 03/17/2014 Reason for consult: Follow-up assessment   Maternal Data    Feeding Feeding Type: Breast Fed  LATCH Score/Interventions Latch: Grasps breast easily, tongue down, lips flanged, rhythmical sucking.  Audible Swallowing: A few with stimulation  Type of Nipple: Everted at rest and after stimulation  Comfort (Breast/Nipple): Filling, red/small blisters or bruises, mild/mod discomfort  Problem noted: Cracked, bleeding, blisters, bruises;Mild/Moderate discomfort Interventions  (Cracked/bleeding/bruising/blister): Hand pump Interventions (Mild/moderate discomfort): Comfort gels  Hold (Positioning): No assistance needed to correctly position infant at breast.  LATCH Score: 8  Lactation Tools Discussed/Used     Consult Status Consult Status: Follow-up Date: 03/18/14 Follow-up type: In-patient    Dahlia ByesBerkelhammer, Ruth [facility] 03/17/2014, 2:27 PM

## 2014-03-18 ENCOUNTER — Ambulatory Visit: Payer: Self-pay

## 2014-03-18 NOTE — Lactation Note (Signed)
This note was copied from the chart of Mandy Miyo Felmlee. Lactation Consultation Note  8.5% Weight loss.  Mother states baby falls asleep during feedings and sometimes only breastfeeds for a few minutes. Mother was able to express good flow of transitional breastmilk. Suggest mother undress baby to diaper for every feeding.  Reviewed breast massage and other waking techniques. Mother's nipples are bruised and tender.  Provided mother w/ another set of comfort gels. Mother latched baby in football hold.  Demonstrated how to compress breast to achieve a deeper latch. Mother had ridge after feeding on right breast.  Assisted w/latching deeper on left breast. Recommend if baby breastfeeds and falls asleep on first breast, burp her and offer her second breast. Viewed feeding for more than 15 minutes. Rhythmical sucks and lots of swallows viewed. Mom encouraged to feed baby 8-12 times/24 hours and with feeding cues.  Reminded parents to set their alarm for 3.5 hours so if they all sleep they will wake her for feedings.   Patient Name: Mandy Terry Today's Date: 03/18/2014 Reason for consult: Infant weight loss;Follow-up assessment   Maternal Data    Feeding Feeding Type: Breast Fed Length of feed: 7 min  LATCH Score/Interventions Latch: Grasps breast easily, tongue down, lips flanged, rhythmical sucking. Intervention(s): Adjust position;Assist with latch;Breast massage;Breast compression  Audible Swallowing: Spontaneous and intermittent Intervention(s): Skin to skin;Hand expression;Alternate breast massage  Type of Nipple: Everted at rest and after stimulation Intervention(s): No intervention needed  Comfort (Breast/Nipple): Filling, red/small blisters or bruises, mild/mod discomfort  Problem noted: Mild/Moderate discomfort Interventions  (Cracked/bleeding/bruising/blister): Expressed breast milk to nipple Interventions (Mild/moderate discomfort): Comfort gels  Hold  (Positioning): Assistance needed to correctly position infant at breast and maintain latch.  LATCH Score: 8  Lactation Tools Discussed/Used     Consult Status Consult Status: Follow-up Date: 03/19/14 Follow-up type: In-patient    Mandy Terry, Mandy Terry 03/18/2014, 9:19 AM

## 2014-03-19 ENCOUNTER — Ambulatory Visit: Payer: Self-pay

## 2014-03-19 NOTE — Lactation Note (Signed)
This note was copied from the chart of Mandy Jamelle Faria. Lactation Consultation Note  Baby's weight increased from 8.5 to 8.3%  Mother tearful and worried her Pediatrician may have her stop breastfeeding at weight check on Tuesday.  Described how to boost her milk supply with pumping and reassured her to keep following the plan of waking baby to breastfeed and keeping baby awake for feedings. Mother's nipples have scabs.  Reviewed how to achieve a deeper latch.  Gave her shells and more gels. Discussed monitoring voids/stools and engorgement care. Encouraged her to call if she has further questions.    Patient Name: Mandy Dinah BeersSummer Terry Today's Date: 03/19/2014 Reason for consult: Follow-up assessment   Maternal Data    Feeding    LATCH Score/Interventions             Interventions (Mild/moderate discomfort): Comfort gels        Lactation Tools Discussed/Used     Consult Status Consult Status: Complete    Hardie PulleyBerkelhammer, Ruth Boschen 03/19/2014, 10:30 AM

## 2014-03-19 NOTE — Progress Notes (Signed)
Clinical Social Work Department PSYCHOSOCIAL ASSESSMENT - MATERNAL/CHILD 03/19/2014  Patient:  Mandy SessionDELEN,[name]  Account Number:  [id number]  Admit Date:  03/15/2014  Marjo Bickerhilds Name:   Mandy Terry    Clinical Social Worker:  Jaquawn Saffran, LCSW   Date/Time:  03/19/2014 11:00 AM  Date Referred:  03/19/2014   Referral source  Central Nursery     Referred reason  Psychosocial assessment   Other referral source:    I:  FAMILY / HOME ENVIRONMENT Child's legal guardian:  PARENT  Guardian - Name Guardian - Age Guardian - Address  Terry,[name] [address] [place], [place] [id number]  Henrene PastorHoldaway, Mandy  same as above   Other household support members/support persons Other support:    II  PSYCHOSOCIAL DATA Information Source:    Event organiserinancial and Community Resources Employment:   Both parents employed   Surveyor, quantityinancial resources:  [employer] Energy[employer]icaid If [employer] [place]icaid - [place]:   Building services engineerther  Food Stamps   School / Grade:   Maternity Care Coordinator / Child Services Coordination / Early Interventions:  Cultural issues impacting care:    III  STRENGTHS Strengths  Supportive family/friends  Home prepared for Child (including basic supplies)  Adequate Resources   Strength comment:    IV  RISK FACTORS AND CURRENT PROBLEMS Current Problem:       V  SOCIAL WORK ASSESSMENT Acknowledged order for social work consult.  Mother was tearful when speaking with MD and reported experiencing negative emotions that were not felt before.  She denies any hx of anxiety or depression.  Spoke with mother along. She reported feeling overwhelmed because "this pregnancy was very different from the previous pregnancy.  Mother states that she got pregnant with her first child at age 24 and delivered her at age 24.  Father of newborn is also the father of her first child.  She is worried about being a good parent now that she is older, more mature, and understand the significant role of a parent.   She's afraid of "not being a  good parent".  She is also stressed about whether she will be approved for subsidized daycare.  Spoke with her regarding various techniques she can use to problem solve and increase coping.   Mother states that she is afraid that FOB will become overwhelmed and leave her, although she admits that he has been very supportive and never once gave her any reason to believe that he would do this, she worries about it.  Validated her feelings, and provided extensive support throughout the discussion. Encouraged mother to dialogue with FOB about her feelings and concerns.  and allow She denies any hx of illicit drug use.   No acute social concerns noted or reported at this time.    Mother informed of social work Surveyor, miningavailability.      VI SOCIAL WORK PLAN Social Work Plan  No Further Intervention Required / No Barriers to Discharge   Type of pt/family education:   PP Depression information and resources  Encouraged mother to seek individual threapy   If child protective services report - county:   If child protective services report - date:   Information/referral to community resources comment:   Other social work plan:

## 2014-03-21 NOTE — Progress Notes (Signed)
Post discharge chart review completed.  

## 2015-03-06 IMAGING — US US OB COMP LESS 14 WK
1 series · 14 of 28 positions shown · non-contrast
Comparison: None.

CLINICAL DATA: Pelvic pain

EXAM:
OBSTETRIC <14 WK US AND TRANSVAGINAL OB US
TECHNIQUE: Both transabdominal and transvaginal ultrasound examinations were
performed for complete evaluation of the gestation as well as the
maternal uterus, adnexal regions, and pelvic cul-de-sac.
Transvaginal technique was performed to assess early pregnancy.

[Series 1: us ob comp less 14 wk · 0.22mm/px · 31 acquisitions, 14 frames shown]
[im 2/31]
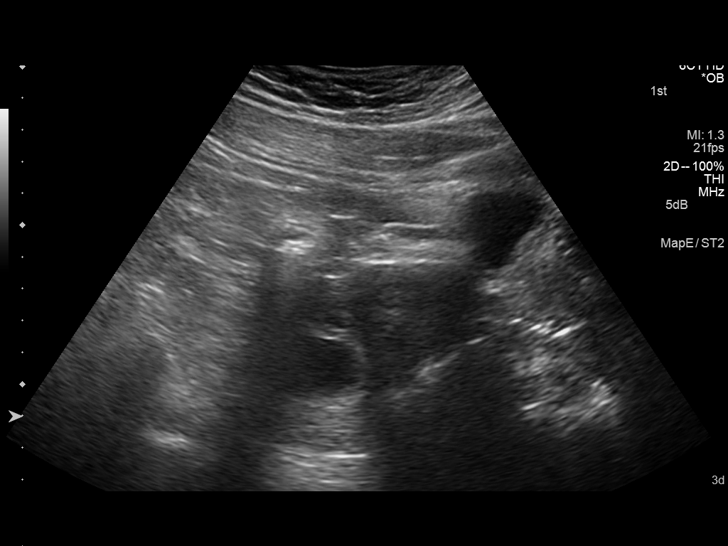
[im 4/31]
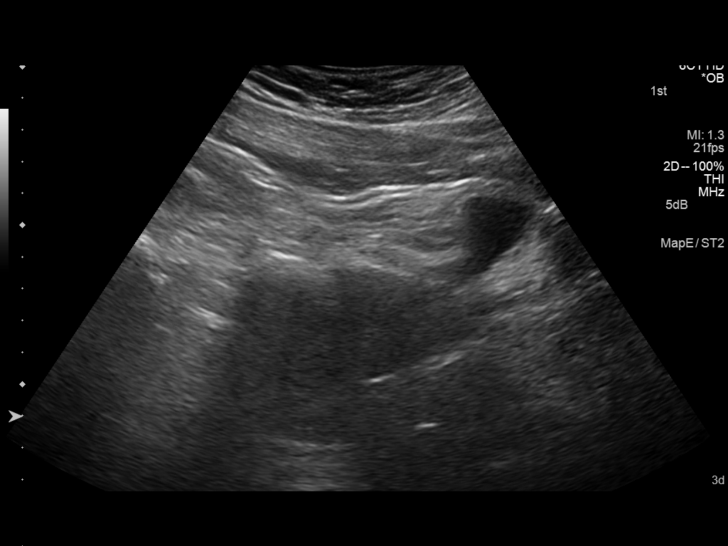
[im 6/31]
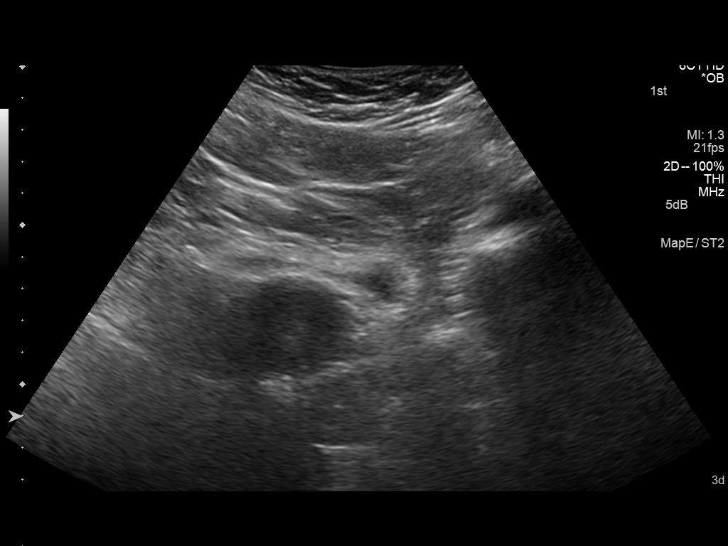
[im 8/31]
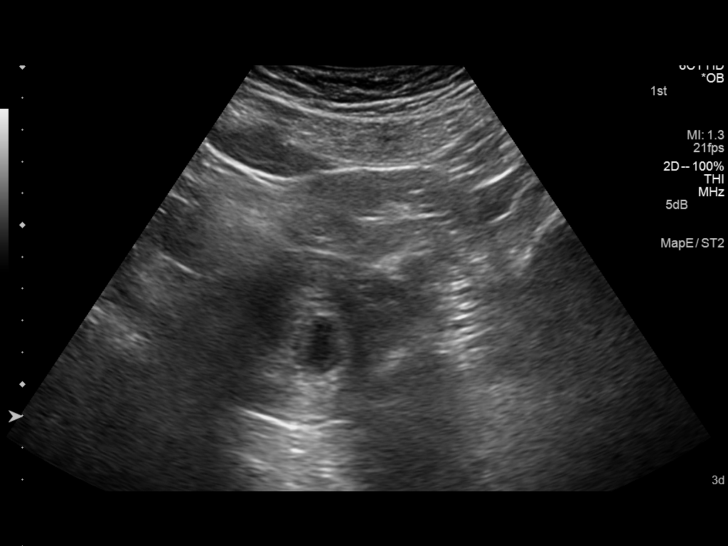
[im 11/31]
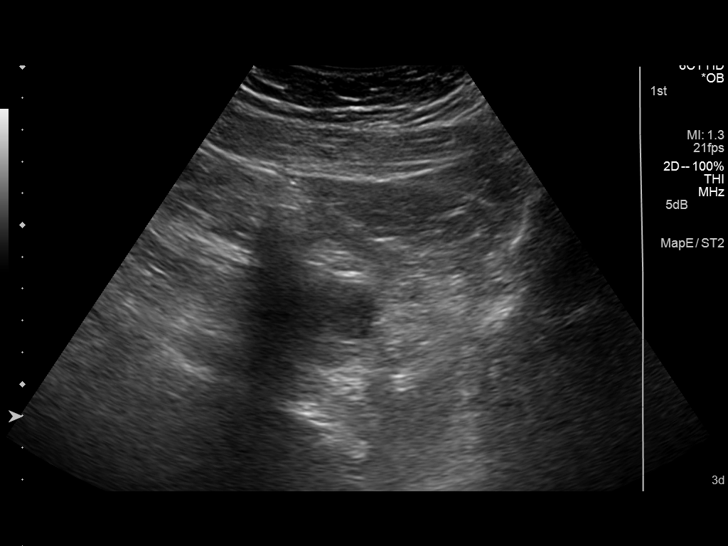
[im 13/31]
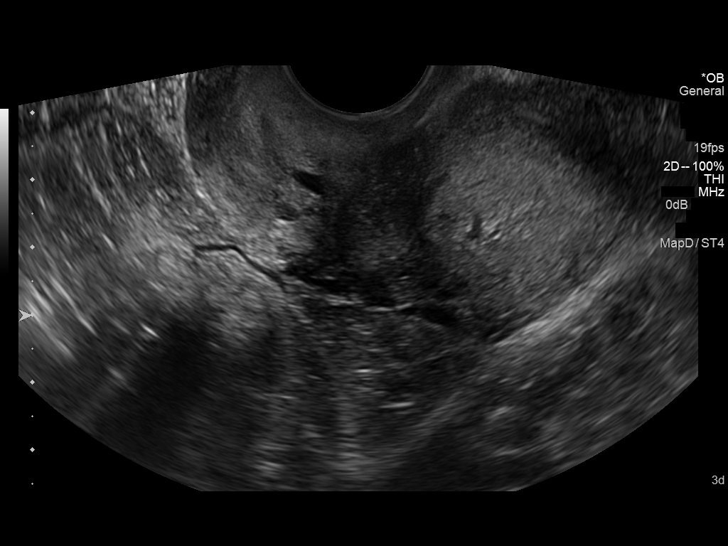
[im 15/31]
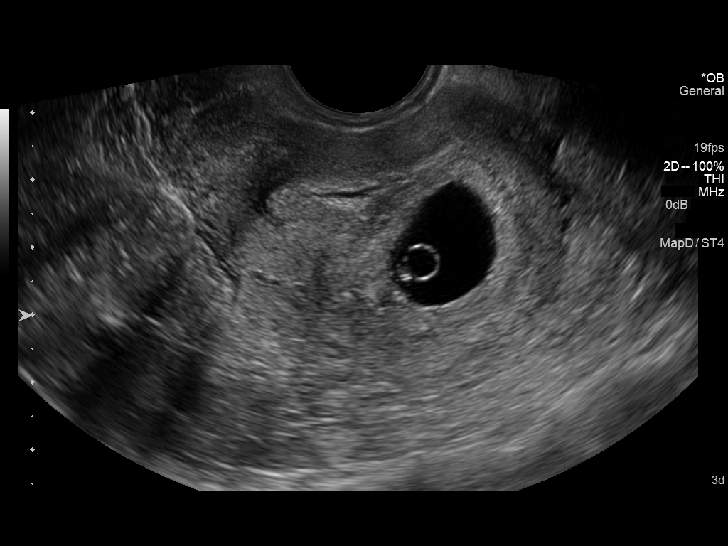
[im 17/31]
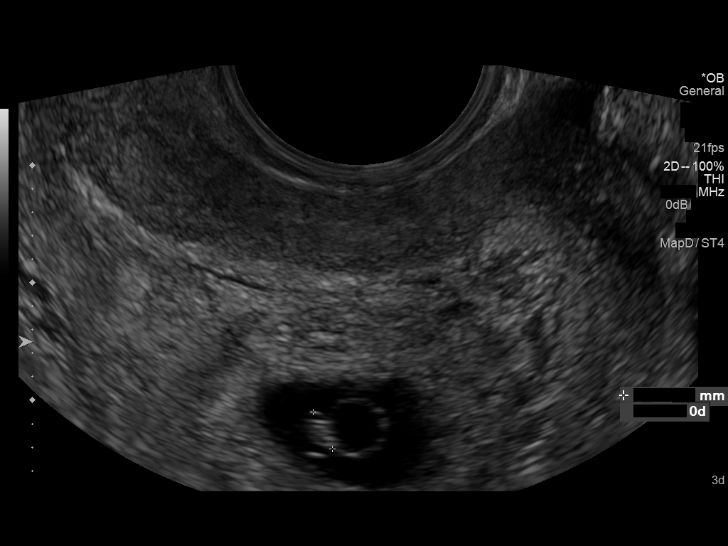
[im 19/31]
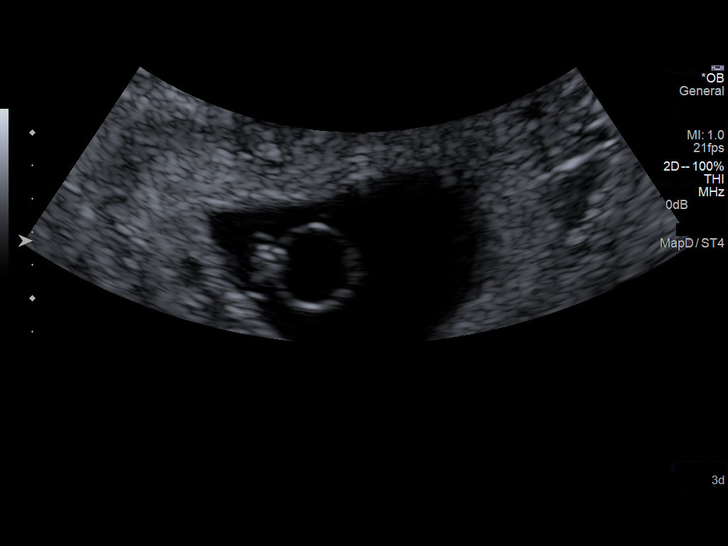
[im 22/31]
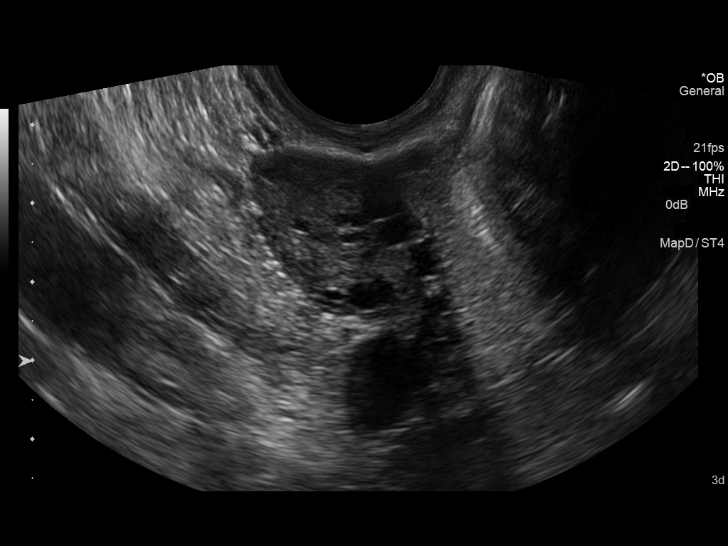
[im 24/31]
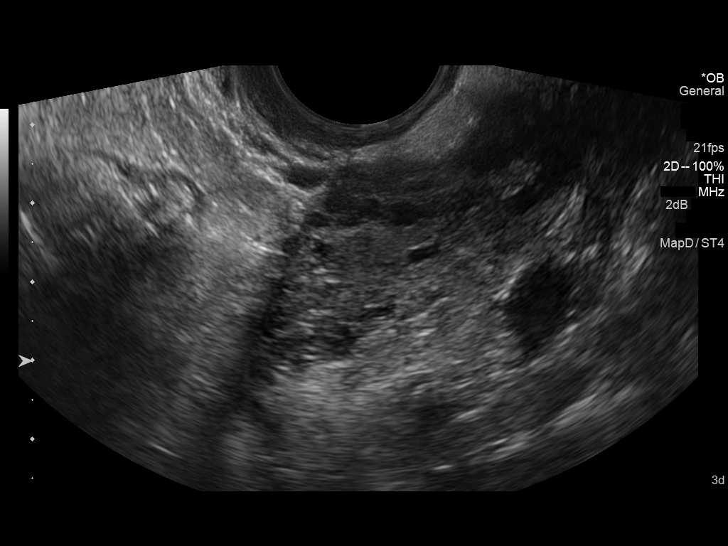
[im 26/31]
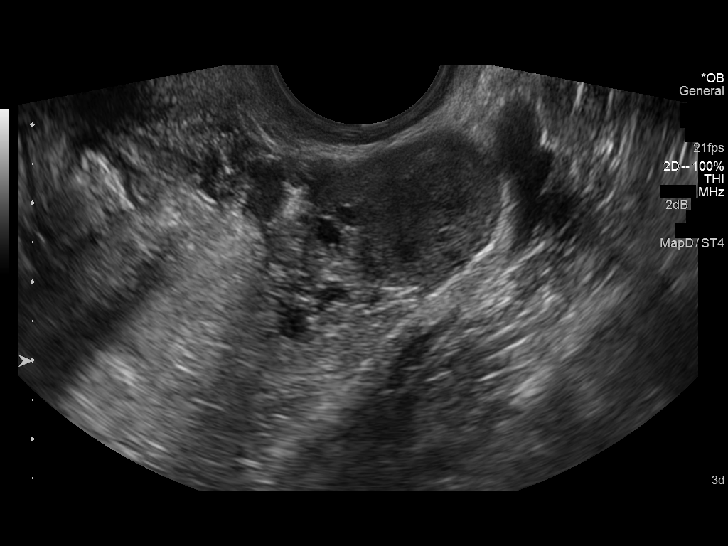
[im 28/31]
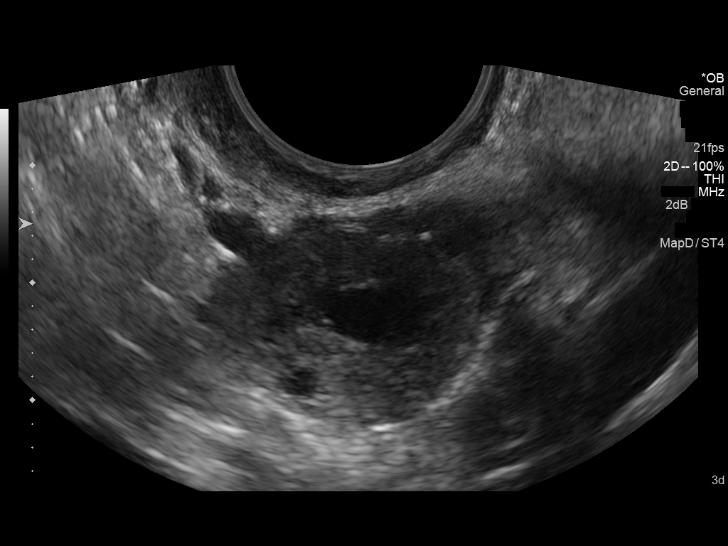
[im 31/31]
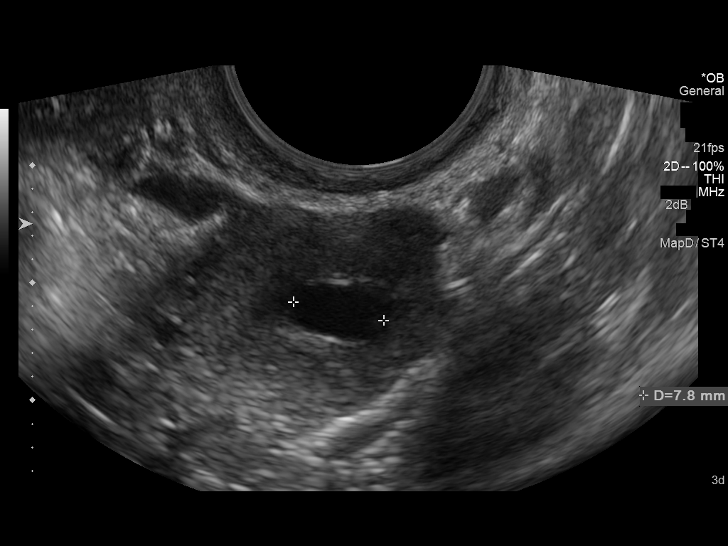

[14 of 28 positions shown; findings below may reference images not displayed]

FINDINGS: Intrauterine gestational sac: Visualized/normal in shape.

Yolk sac:  Visualized

Embryo:  Visualized

Cardiac Activity: Visualized

Heart Rate:  113 bpm

CRL:   3  mm   5 w 6 d                  US EDC: March 20, 2013

Maternal uterus/adnexae: There is no demonstrable subchorionic
hemorrhage. Cervical os appears closed. Maternal ovaries appear
normal bilaterally. No free pelvic fluid.
IMPRESSION: Single live intrauterine gestation with estimated gestational age of
6- weeks. Study otherwise unremarkable.
